# Patient Record
Sex: Male | Born: 1970 | Race: Black or African American | Hispanic: No | Marital: Married | Smoking: Never smoker
Health system: Southern US, Community
[De-identification: ages and names within clinical notes are randomized; demographics above are authoritative.]

## PROBLEM LIST (undated history)

## (undated) DIAGNOSIS — I10 Essential (primary) hypertension: Secondary | ICD-10-CM

## (undated) HISTORY — PX: BACK SURGERY: SHX140

## (undated) HISTORY — PX: KNEE SURGERY: SHX244

## (undated) HISTORY — PX: NECK SURGERY: SHX720

## (undated) HISTORY — PX: ACHILLES TENDON REPAIR: SUR1153

## (undated) HISTORY — PX: ROTATOR CUFF REPAIR: SHX139

---

## 1999-06-22 ENCOUNTER — Emergency Department (HOSPITAL_COMMUNITY): Admission: EM | Admit: 1999-06-22 | Discharge: 1999-06-22 | Payer: Self-pay

## 1999-06-22 ENCOUNTER — Encounter: Payer: Self-pay | Admitting: General Surgery

## 2000-04-13 ENCOUNTER — Emergency Department (HOSPITAL_COMMUNITY): Admission: EM | Admit: 2000-04-13 | Discharge: 2000-04-13 | Payer: Self-pay | Admitting: Emergency Medicine

## 2021-06-02 ENCOUNTER — Emergency Department (HOSPITAL_BASED_OUTPATIENT_CLINIC_OR_DEPARTMENT_OTHER)
Admission: EM | Admit: 2021-06-02 | Discharge: 2021-06-02 | Disposition: A | Discharge status: Home / Self Care | Payer: Medicare Other | Attending: Emergency Medicine | Admitting: Emergency Medicine

## 2021-06-02 ENCOUNTER — Other Ambulatory Visit: Payer: Self-pay

## 2021-06-02 ENCOUNTER — Encounter (HOSPITAL_BASED_OUTPATIENT_CLINIC_OR_DEPARTMENT_OTHER): Payer: Self-pay | Admitting: *Deleted

## 2021-06-02 ENCOUNTER — Emergency Department (HOSPITAL_BASED_OUTPATIENT_CLINIC_OR_DEPARTMENT_OTHER): Payer: Medicare Other

## 2021-06-02 DIAGNOSIS — I10 Essential (primary) hypertension: Secondary | ICD-10-CM | POA: Diagnosis not present

## 2021-06-02 DIAGNOSIS — M79671 Pain in right foot: Secondary | ICD-10-CM

## 2021-06-02 DIAGNOSIS — M25571 Pain in right ankle and joints of right foot: Secondary | ICD-10-CM | POA: Diagnosis present

## 2021-06-02 HISTORY — DX: Essential (primary) hypertension: I10

## 2021-06-02 NOTE — Discharge Instructions (Signed)
Please read and follow all provided instructions.  Your diagnoses today include:  1. Pain of right heel     Tests performed today include: An x-ray of the affected area - does NOT show any broken bones Vital signs. See below for your results today.   Medications prescribed:  Please use over-the-counter NSAID medications (ibuprofen, naproxen) as directed on the packaging for pain.   Take any prescribed medications only as directed.  Home care instructions:  Follow any educational materials contained in this packet Follow R.I.C.E. Protocol: R - rest your injury  I  - use ice on injury without applying directly to skin C - compress injury with bandage or splint E - elevate the injury as much as possible  Follow-up instructions: Please follow-up with your primary care provider or the provided orthopedic physician (bone specialist) if you continue to have significant pain in 1 week. In this case you may have a more severe injury that requires further care.   Return instructions:  Please return if your toes or feet are numb or tingling, appear gray or blue, or you have severe pain (also elevate the leg and loosen splint or wrap if you were given one) Please return to the Emergency Department if you experience worsening symptoms.  Please return if you have any other emergent concerns.  Additional Information:  Your vital signs today were: BP (!) 147/101 (BP Location: Right Arm)   Pulse 80   Temp 98.5 F (36.9 C) (Oral)   Resp 20   Ht 5' 9"  (1.753 m)   Wt 94.8 kg   SpO2 100%   BMI 30.86 kg/m  If your blood pressure (BP) was elevated above 135/85 this visit, please have this repeated by your doctor within one month. -------------- If prescribed crutches for your injury: use crutches with non-weight bearing for the first few days. Then, you may walk as the pain allows, or as instructed. Start gradually with weight bearing on the affected side. Once you can walk pain free, then try  jogging. When you can run forwards, then you can try moving side-to-side. If you cannot walk without crutches in one week, you need a re-check. --------------

## 2021-06-02 NOTE — ED Triage Notes (Signed)
Pt reports he ruptured his right achilles tendon in 2007. Two days ago he began having pain in the same area after getting up out of bed. Reports increased pain and swelling over the last 2 days. States he has been icing and elevating foot x 2 days

## 2021-06-02 NOTE — ED Provider Notes (Signed)
[facility] EMERGENCY DEPARTMENT Provider Note   CSN: [id number] Arrival date & time: 06/02/21  1248     History Chief Complaint  Patient presents with   Foot Pain    Steve Wilkerson is a 50 y.o. male.  Patient with remote history of right Achilles rupture status postsurgical repair --presents to the emergency department for evaluation of right heel pain.  Patient denies acute injury.  He states that he woke up a couple days ago and had pain in the heel, worse with bearing weight and walking.  Symptoms have felt severe at times.  Denies knee pain.  Not improved with conservative treatments at home, including applying ice and heat.  No fevers.      Past Medical History:  Diagnosis Date   Hypertension     There are no problems to display for this patient.   The histories are not reviewed yet. Please review them in the "History" navigator section and refresh this [place].     No family history on file.  Social History   Tobacco Use   Smoking status: Never    Passive exposure: Never   Smokeless tobacco: Never  Substance Use Topics   Alcohol use: Yes    Comment: occasional    Home Medications Prior to Admission medications   Not on File    Allergies    Patient has no known allergies.  Review of Systems   Review of Systems  Constitutional:  Negative for fever.  Musculoskeletal:  Positive for arthralgias, gait problem and joint swelling. Negative for back pain.  Skin:  Negative for wound.  Neurological:  Negative for weakness and numbness.   Physical Exam Updated Vital Signs BP (!) 147/101 (BP Location: Right Arm)   Pulse 80   Temp 98.5 F (36.9 C) (Oral)   Resp 20   Ht 5' 9"  (1.753 m)   Wt 94.8 kg   SpO2 100%   BMI 30.86 kg/m   Physical Exam Vitals and nursing note reviewed.  Constitutional:      Appearance: He is well-developed.  HENT:     Head: Normocephalic and atraumatic.  Eyes:     Conjunctiva/sclera: Conjunctivae normal.   Cardiovascular:     Pulses: Normal pulses. No decreased pulses.  Musculoskeletal:        General: Tenderness present.     Cervical back: Normal range of motion and neck supple.     Right knee: Normal range of motion. No tenderness.     Right lower leg: No edema.     Left lower leg: No edema.     Right ankle: Tenderness present. Decreased range of motion.     Right Achilles Tendon: Tenderness present. No defects. Thompson's test negative.     Left ankle:     Left Achilles Tendon: No tenderness or defects. Thompson's test negative.  Skin:    General: Skin is warm and dry.  Neurological:     Mental Status: He is alert.     Sensory: No sensory deficit.     Comments: Motor, sensation, and vascular distal to the injury is fully intact.   Psychiatric:        Mood and Affect: Mood normal.    ED Results / Procedures / Treatments   Labs (all labs ordered are listed, but only abnormal results are displayed) Labs Reviewed - No data to display  EKG None  Radiology No results found.  Procedures Procedures   Medications Ordered in ED Medications - No  data to display  ED Course  I have reviewed the triage vital signs and the nursing notes.  Pertinent labs & imaging results that were available during my care of the patient were reviewed by me and considered in my medical decision making (see chart for details).  Patient seen and examined.  Will obtain x-ray.  If negative, will give crutches, sports medicine follow-up.  Vital signs reviewed and are as follows: BP (!) 147/101 (BP Location: Right Arm)   Pulse 80   Temp 98.5 F (36.9 C) (Oral)   Resp 20   Ht 5' 9"  (1.753 m)   Wt 94.8 kg   SpO2 100%   BMI 30.86 kg/m   3:58 PM x-ray negative.  Patient updated.  Orthopedic referral provided.  Discussed rice protocol, NSAIDs.  Provided crutches.      MDM Rules/Calculators/A&P                           Patient with heel pain, no acute injuries.  History of Achilles tendon  repair.  No signs of disruption today.  Encouraged orthopedic follow-up.  Final Clinical Impression(s) / ED Diagnoses Final diagnoses:  Pain of right heel    Rx / DC Orders ED Discharge Orders     None        Carlisle Cater, PA-C 06/02/21 1558    Arnaldo Natal, MD 06/02/21 1606

## 2021-06-03 NOTE — Progress Notes (Signed)
  Steve Wilkerson - 49 y.o. male MRN [id number]  Date of birth: 02-24-1971  SUBJECTIVE:  Including CC & ROS.  No chief complaint on file.   Steve Wilkerson is a 50 y.o. male that is presenting with right Achilles pain.  He was using a Metall[employer]ist and had subsequent severe pain of the Achilles.  Having swelling and trouble walking.  Has a history of Achilles rupture and repair in 2007.  Has been using ice.  Independent review of the right ankle x-ray from 7/31 shows no acute changes.   Review of Systems See HPI   HISTORY: Past Medical, Surgical, Social, and Family History Reviewed & Updated per EMR.   Pertinent Historical Findings include:  Past Medical History:  Diagnosis Date   Hypertension     Past Surgical History:  Procedure Laterality Date   ACHILLES TENDON REPAIR     BACK SURGERY     KNEE SURGERY     NECK SURGERY     ROTATOR CUFF REPAIR      History reviewed. No pertinent family history.  Social History   Socioeconomic History   Marital status: Married    Spouse name: Not on file   Number of children: Not on file   Years of education: Not on file   Highest education level: Not on file  Occupational History   Not on file  Tobacco Use   Smoking status: Never    Passive exposure: Never   Smokeless tobacco: Never  Substance and Sexual Activity   Alcohol use: Yes    Comment: occasional   Drug use: Not on file   Sexual activity: Not on file  Other Topics Concern   Not on file  Social History Narrative   Not on file   Social Determinants of Health   Financial Resource Strain: Not on file  Food Insecurity: Not on file  Transportation Needs: Not on file  Physical Activity: Not on file  Stress: Not on file  Social Connections: Not on file  Intimate Partner Violence: Not on file     PHYSICAL EXAM:  VS: BP (!) 126/100 (BP Location: Left Arm, Patient Position: Sitting, Cuff Size: Large)   Ht 5' 10"  (1.778 m)   Wt 209 lb (94.8 kg)   BMI 29.99 kg/m   Physical Exam Gen: NAD, alert, cooperative with exam, well-appearing MSK:  Right Achilles: Swelling appreciated of the Achilles. Plantarflexion with [place] Silos test. Neurovascularly intact  Limited ultrasound: Right Achilles:  Normal insertion and proximal Achilles tendon. Hyperechoic changes representing the sutures from previous repair. The mid belly of the Achilles is widened with hypoechoic and hyperemia changes  Summary: Partial tearing of the Achilles  Ultrasound and interpretation by Clearance Coots, MD    ASSESSMENT & PLAN:   Partial tear of right Achilles tendon History of repair of rupture in 2007.  Has plantarflexion on exam with significant thickening and acute changes on ultrasound. -Counseled on home exercise therapy and supportive care. -Heel lift and cam walker. -Nitro patches. -Could consider physical therapy.

## 2021-06-04 ENCOUNTER — Other Ambulatory Visit: Payer: Self-pay

## 2021-06-04 ENCOUNTER — Ambulatory Visit (INDEPENDENT_AMBULATORY_CARE_PROVIDER_SITE_OTHER): Payer: Medicare Other | Admitting: Family Medicine

## 2021-06-04 ENCOUNTER — Ambulatory Visit: Payer: Self-pay

## 2021-06-04 ENCOUNTER — Encounter: Payer: Self-pay | Admitting: Family Medicine

## 2021-06-04 VITALS — BP 126/100 | Ht 70.0 in | Wt 209.0 lb

## 2021-06-04 DIAGNOSIS — S86011A Strain of right Achilles tendon, initial encounter: Secondary | ICD-10-CM | POA: Diagnosis not present

## 2021-06-04 DIAGNOSIS — M7660 Achilles tendinitis, unspecified leg: Secondary | ICD-10-CM

## 2021-06-04 MED ORDER — NITROGLYCERIN 0.2 MG/HR TD PT24: MEDICATED_PATCH | TRANSDERMAL | 11 refills | Status: DC

## 2021-06-04 NOTE — Assessment & Plan Note (Signed)
History of repair of rupture in 2007.  Has plantarflexion on exam with significant thickening and acute changes on ultrasound. -Counseled on home exercise therapy and supportive care. -Heel lift and cam walker. -Nitro patches. -Could consider physical therapy.

## 2021-06-04 NOTE — Patient Instructions (Signed)
Nice to meet you Please continue ice  Please use the boot with heel lift  Please try the nitro patches   Please send me a message in MyChart with any questions or updates.  Please see me back in 2-3 weeks.   --Dr. Raeford Razor  Nitroglycerin Protocol  Apply 1/4 nitroglycerin patch to affected area daily. Change position of patch within the affected area every 24 hours. You may experience a headache during the first 1-2 weeks of using the patch, these should subside. If you experience headaches after beginning nitroglycerin patch treatment, you may take your preferred over the counter pain reliever. Another side effect of the nitroglycerin patch is skin irritation or rash related to patch adhesive. Please notify our office if you develop more severe headaches or rash, and stop the patch. Tendon healing with nitroglycerin patch may require 12 to 24 weeks depending on the extent of injury. Men should not use if taking Viagra, Cialis, or Levitra.  Do not use if you have migraines or rosacea.

## 2021-06-17 ENCOUNTER — Ambulatory Visit (INDEPENDENT_AMBULATORY_CARE_PROVIDER_SITE_OTHER): Payer: Medicare Other | Admitting: Family Medicine

## 2021-06-17 ENCOUNTER — Other Ambulatory Visit: Payer: Self-pay

## 2021-06-17 ENCOUNTER — Encounter: Payer: Self-pay | Admitting: Family Medicine

## 2021-06-17 VITALS — BP 150/92 | Ht 70.0 in | Wt 209.0 lb

## 2021-06-17 DIAGNOSIS — S86011D Strain of right Achilles tendon, subsequent encounter: Secondary | ICD-10-CM

## 2021-06-17 NOTE — Progress Notes (Signed)
  Steve Wilkerson - 50 y.o. male MRN [id number]  Date of birth: May 19, 1971  SUBJECTIVE:  Including CC & ROS.  No chief complaint on file.   Steve Wilkerson is a 50 y.o. male that is following up for his Achilles pain.  He has been improving in his function and movement..   Review of Systems See HPI   HISTORY: Past Medical, Surgical, Social, and Family History Reviewed & Updated per EMR.   Pertinent Historical Findings include:  Past Medical History:  Diagnosis Date   Hypertension     Past Surgical History:  Procedure Laterality Date   ACHILLES TENDON REPAIR     BACK SURGERY     KNEE SURGERY     NECK SURGERY     ROTATOR CUFF REPAIR      History reviewed. No pertinent family history.  Social History   Socioeconomic History   Marital status: Married    Spouse name: Not on file   Number of children: Not on file   Years of education: Not on file   Highest education level: Not on file  Occupational History   Not on file  Tobacco Use   Smoking status: Never    Passive exposure: Never   Smokeless tobacco: Never  Substance and Sexual Activity   Alcohol use: Yes    Comment: occasional   Drug use: Not on file   Sexual activity: Not on file  Other Topics Concern   Not on file  Social History Narrative   Not on file   Social Determinants of Health   Financial Resource Strain: Not on file  Food Insecurity: Not on file  Transportation Needs: Not on file  Physical Activity: Not on file  Stress: Not on file  Social Connections: Not on file  Intimate Partner Violence: Not on file     PHYSICAL EXAM:  VS: BP (!) 150/92 (BP Location: Left Arm, Patient Position: Sitting, Cuff Size: Large)   Ht 5' 10"  (1.778 m)   Wt 209 lb (94.8 kg)   BMI 29.99 kg/m  Physical Exam Gen: NAD, alert, cooperative with exam, well-appearing       ASSESSMENT & PLAN:   Partial tear of right Achilles tendon Has gotten improvement with the nitro patches. -Counseled on home exercise  therapy and supportive care. -Continue nitro patches. -Counseled on heel lifts. -Referral to physical therapy.

## 2021-06-17 NOTE — Patient Instructions (Signed)
Good to see you Please continue the heel lifts  Please try physical therapy   Please send me a message in MyChart with any questions or updates.  Please see me back in 4 weeks.   --Dr. Raeford Razor

## 2021-06-17 NOTE — Assessment & Plan Note (Signed)
Has gotten improvement with the nitro patches. -Counseled on home exercise therapy and supportive care. -Continue nitro patches. -Counseled on heel lifts. -Referral to physical therapy.

## 2022-07-20 IMAGING — DX DG ANKLE COMPLETE 3+V*R*
3 series · 3 of 3 positions shown · non-contrast
Comparison: None.

CLINICAL DATA: Heel pain.  Remote history of Achilles repair.

EXAM:
RIGHT ANKLE - COMPLETE 3+ VIEW

[ankle ap]
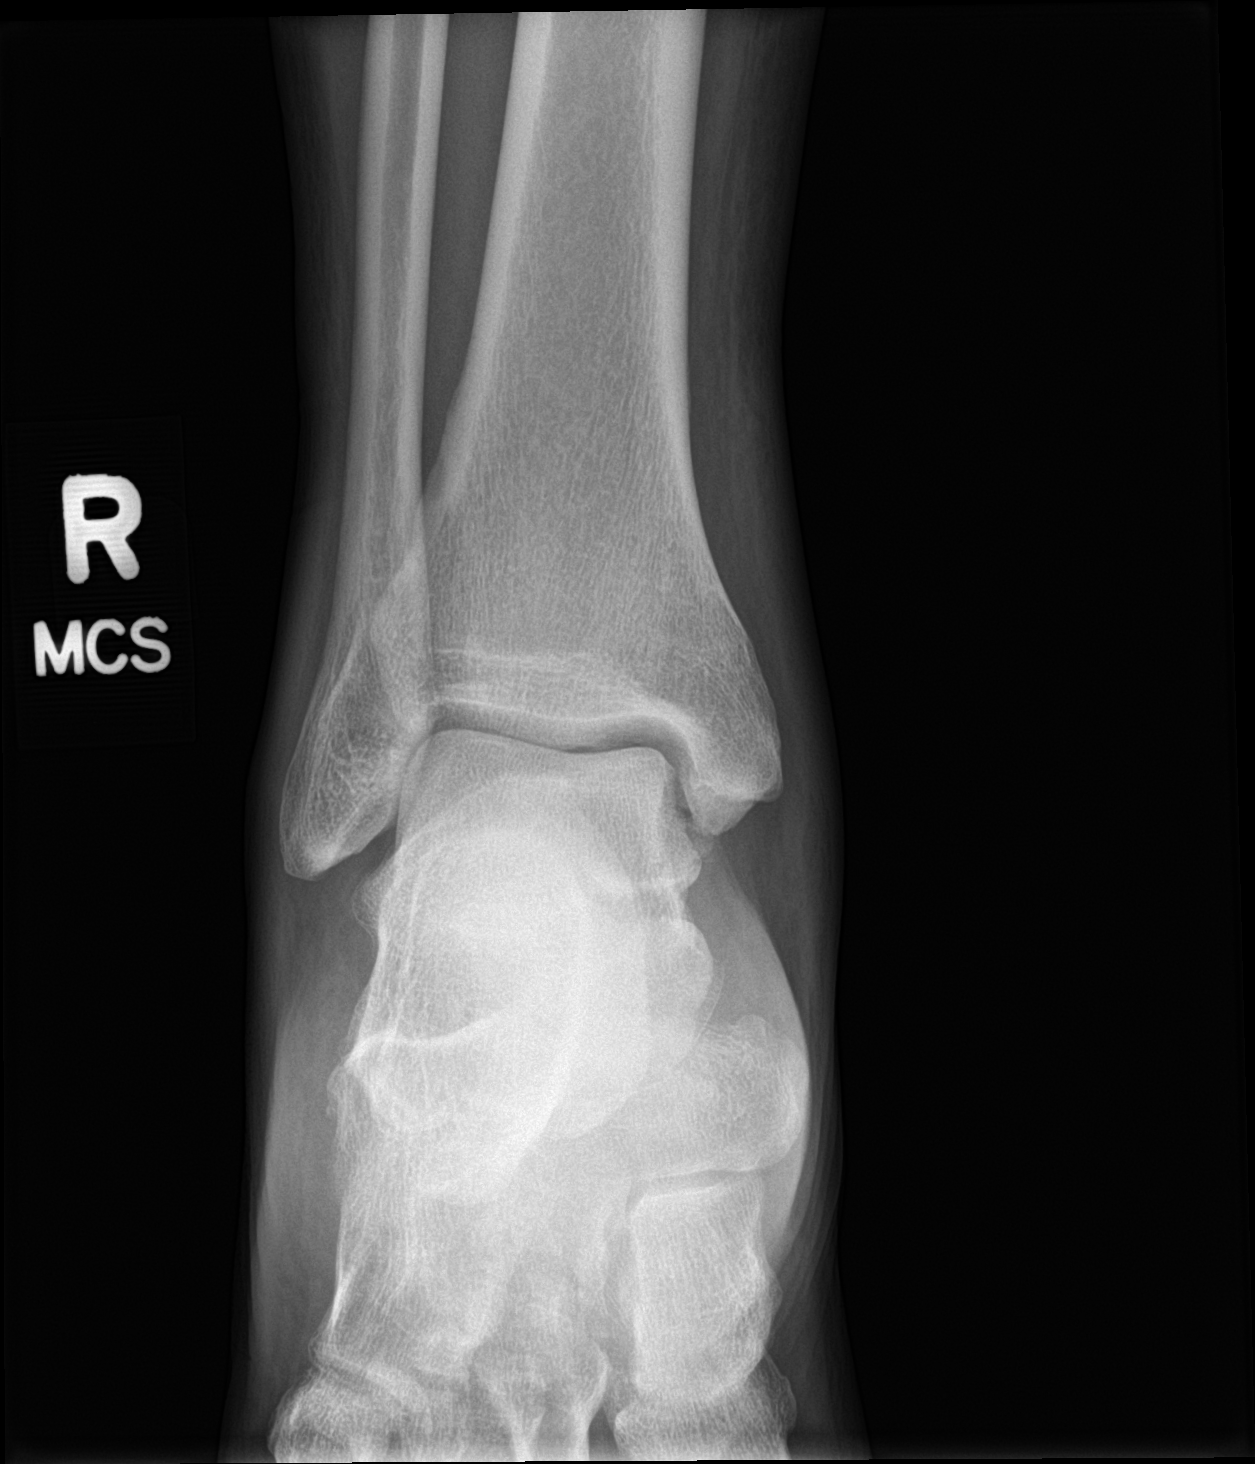

[ankle obl]
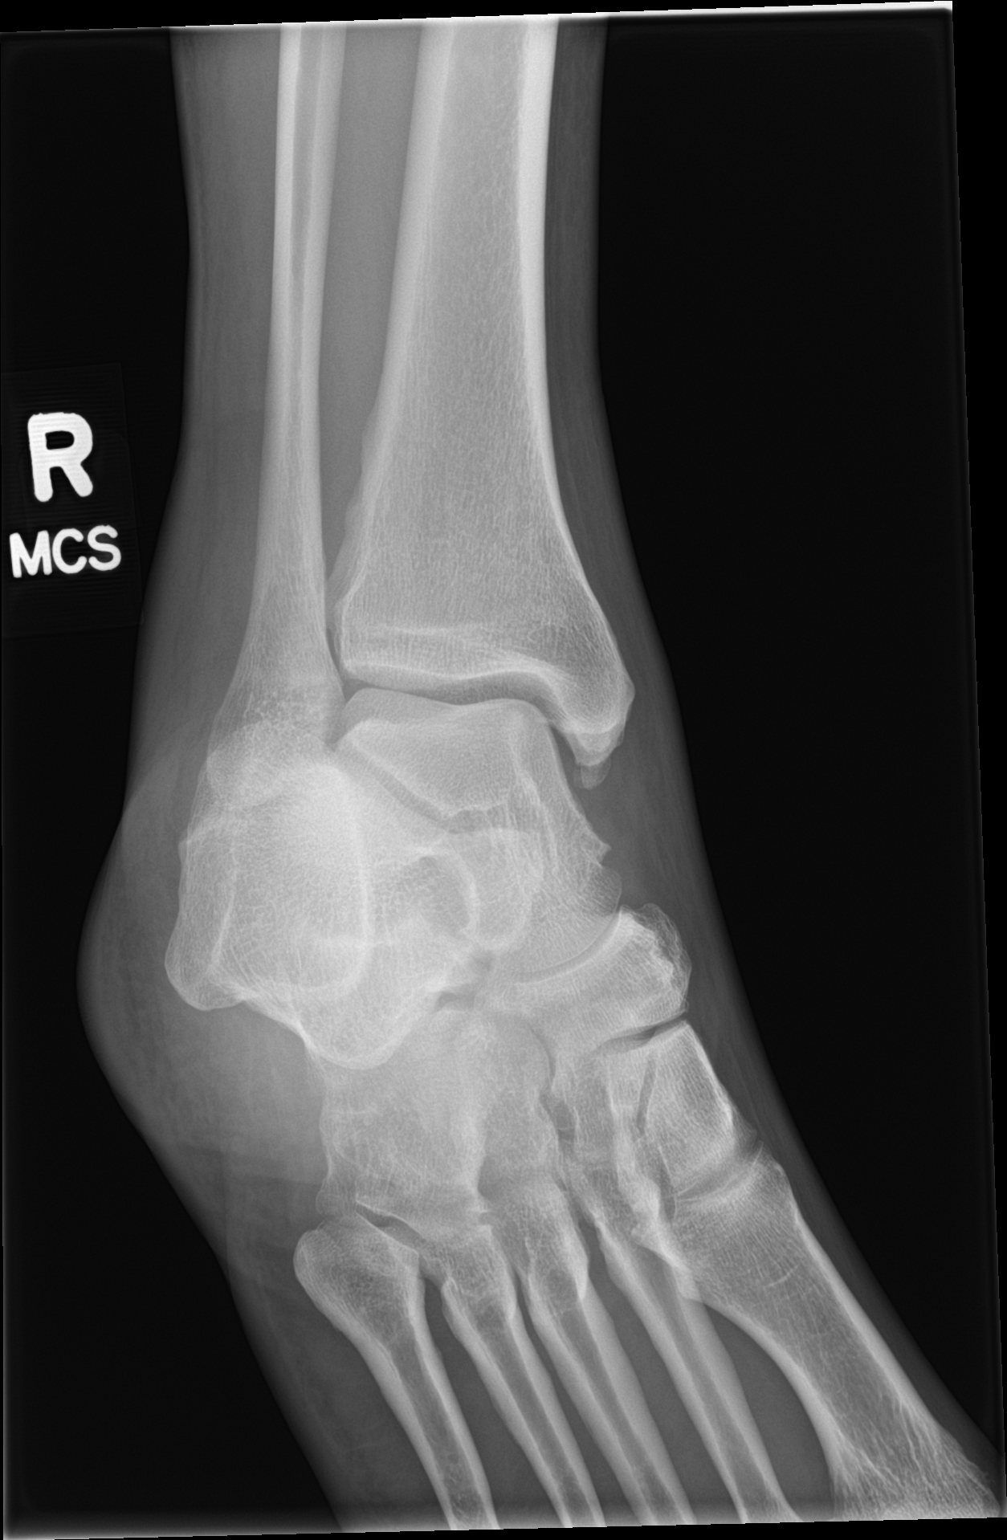

[ankle lat]
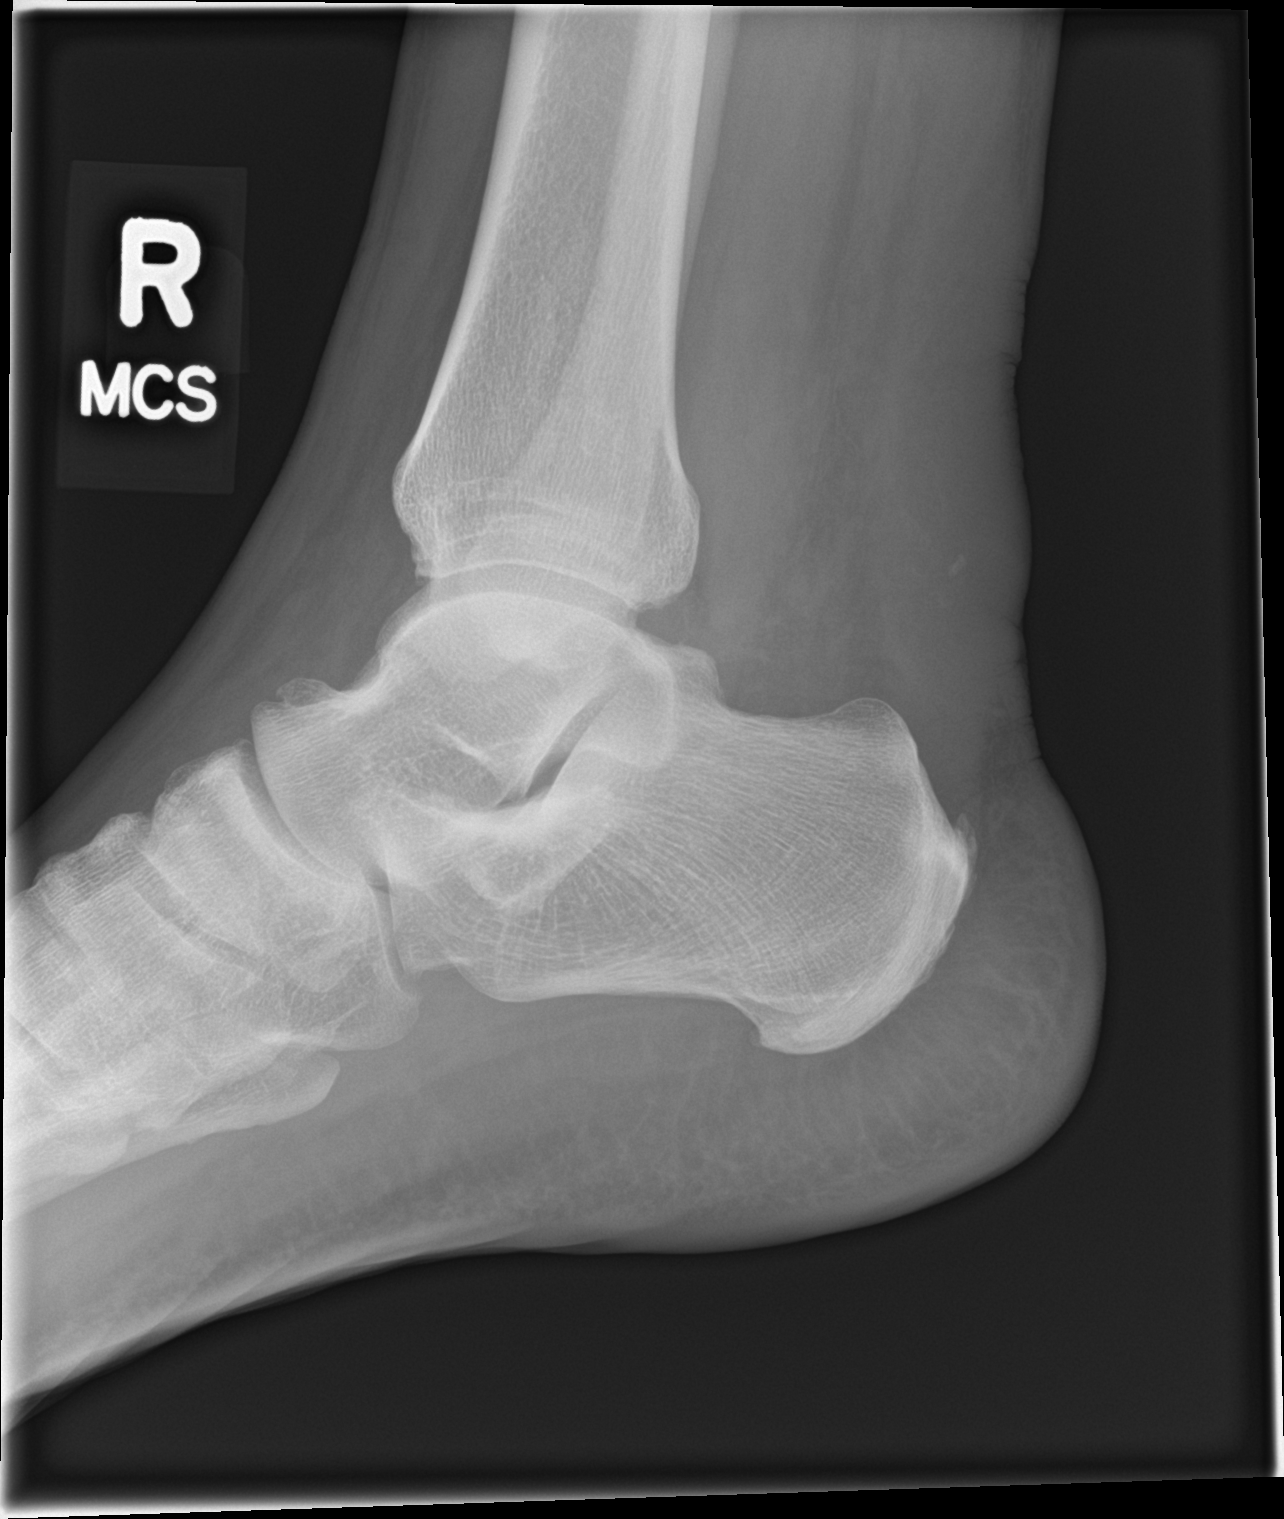

[3 of 3 positions shown; findings below may reference images not displayed]

FINDINGS: There is no evidence of fracture, dislocation, or joint effusion.
There is no evidence of arthropathy or other focal bone abnormality.
Soft tissues are unremarkable.
IMPRESSION: Negative.

## 2023-02-19 ENCOUNTER — Encounter: Payer: Self-pay | Admitting: *Deleted

## 2023-08-25 ENCOUNTER — Ambulatory Visit
Admission: EM | Admit: 2023-08-25 | Discharge: 2023-08-25 | Disposition: A | Discharge status: Home / Self Care | Payer: Medicare Other | Attending: Internal Medicine | Admitting: Internal Medicine

## 2023-08-25 DIAGNOSIS — T63441A Toxic effect of venom of bees, accidental (unintentional), initial encounter: Secondary | ICD-10-CM

## 2023-08-25 DIAGNOSIS — L03113 Cellulitis of right upper limb: Secondary | ICD-10-CM

## 2023-08-25 DIAGNOSIS — M7989 Other specified soft tissue disorders: Secondary | ICD-10-CM | POA: Diagnosis not present

## 2023-08-25 DIAGNOSIS — M79631 Pain in right forearm: Secondary | ICD-10-CM

## 2023-08-25 MED ORDER — CEPHALEXIN 500 MG PO CAPS: 500.0000 mg | ORAL_CAPSULE | Freq: Three times a day (TID) | ORAL | 0 refills | Status: AC

## 2023-08-25 MED ORDER — PREDNISONE 20 MG PO TABS: ORAL_TABLET | ORAL | 0 refills | Status: AC

## 2023-08-25 MED ORDER — EPINEPHRINE 0.3 MG/0.3ML IJ SOAJ: 0.3000 mg | INTRAMUSCULAR | 0 refills | Status: AC | PRN

## 2023-08-25 NOTE — ED Provider Notes (Signed)
[facility] - [facility]  Note:  This document was prepared using Conservation officer, historic buildings and may include unintentional dictation errors.  MRN: [id number] DOB: [birth year]  Subjective:   Steve Wilkerson is a 52 y.o. male presenting for 1 day history of moderate to severe swelling of the right forearm.  Patient was stung by an insect last night.  Believes it was a bee.  Has previously suffered a sting to the face which caused severe facial swelling but not anaphylaxis.  He has taken some Benadryl with some help.  However now he is feeling pain, more redness and warmth about the forearm.  No fever, throat closing sensation, shortness of breath, chest tightness, wheezing, nausea, vomiting.  No current facility-administered medications for this encounter.  Current Outpatient Medications:    nitroGLYCERIN (NITRODUR - DOSED IN MG/24 HR) 0.2 mg/hr patch, Cut and apply 1/4 patch to most painful area q24h., Disp: 30 patch, Rfl: 11   No Known Allergies  Past Medical History:  Diagnosis Date   Hypertension      Past Surgical History:  Procedure Laterality Date   ACHILLES TENDON REPAIR     BACK SURGERY     KNEE SURGERY     NECK SURGERY     ROTATOR CUFF REPAIR      No family history on file.  Social History   Tobacco Use   Smoking status: Never    Passive exposure: Never   Smokeless tobacco: Never  Substance Use Topics   Alcohol use: Yes    Comment: occasional    ROS   Objective:   Vitals: There were no vitals taken for this visit.  Physical Exam Constitutional:      General: He is not in acute distress.    Appearance: Normal appearance. He is well-developed and normal weight. He is not ill-appearing, toxic-appearing or diaphoretic.  HENT:     Head: Normocephalic and atraumatic.     Right Ear: Tympanic membrane, ear canal and external ear normal. No drainage, swelling or tenderness. No middle ear effusion. There is no impacted cerumen. Tympanic  membrane is not erythematous or bulging.     Left Ear: Tympanic membrane, ear canal and external ear normal. No drainage, swelling or tenderness.  No middle ear effusion. There is no impacted cerumen. Tympanic membrane is not erythematous or bulging.     Nose: Nose normal. No congestion or rhinorrhea.     Mouth/Throat:     Mouth: Mucous membranes are moist.     Pharynx: No oropharyngeal exudate or posterior oropharyngeal erythema.     Comments: Airway is patent, patient is controlling secretions, speaking in full sentences. Eyes:     General: No scleral icterus.       Right eye: No discharge.        Left eye: No discharge.     Extraocular Movements: Extraocular movements intact.     Conjunctiva/sclera: Conjunctivae normal.  Cardiovascular:     Rate and Rhythm: Normal rate and regular rhythm.     Heart sounds: Normal heart sounds. No murmur heard.    No friction rub. No gallop.  Pulmonary:     Effort: Pulmonary effort is normal. No respiratory distress.     Breath sounds: Normal breath sounds. No stridor. No wheezing, rhonchi or rales.  Musculoskeletal:       Arms:     Cervical back: Normal range of motion and neck supple. No rigidity. No muscular tenderness.     Comments: Patient is  neurovascularly intact distally.  Neurological:     General: No focal deficit present.     Mental Status: He is alert and oriented to person, place, and time.  Psychiatric:        Mood and Affect: Mood normal.        Behavior: Behavior normal.        Thought Content: Thought content normal.     Assessment and Plan :   PDMP not reviewed this encounter.  1. Pain and swelling of right forearm   2. Local reaction to bee sting, accidental or unintentional, initial encounter   3. Cellulitis of right upper extremity    I provided patient with an epinephrine pen in the event he suffers another insect sting from a bee, wasp.  Discussed appropriate use of this medication.  For his current localized  reaction, recommended prednisone and will cover for secondary cellulitis with cephalexin.  Counseled patient on potential for adverse effects with medications prescribed/recommended today, ER and return-to-clinic precautions discussed, patient verbalized understanding.    Wallis Bamberg, [place] 08/28/23 1610

## 2023-08-25 NOTE — ED Triage Notes (Signed)
Patient here today with c/o right arm swelling after being stung by a been last night. He also has some tingling sensation on face and left lower leg. Denies any pain. Denies difficulty breathing. He took Benadryl last night which helped some.

## 2024-05-03 ENCOUNTER — Ambulatory Visit
Admission: EM | Admit: 2024-05-03 | Discharge: 2024-05-03 | Disposition: A | Discharge status: Home / Self Care | Attending: Family Medicine | Admitting: Family Medicine

## 2024-05-03 DIAGNOSIS — R066 Hiccough: Secondary | ICD-10-CM | POA: Diagnosis not present

## 2024-05-03 MED ORDER — METOCLOPRAMIDE HCL 5 MG/ML IJ SOLN
10.0000 mg | Freq: Once | INTRAMUSCULAR | Status: AC
  Administered 2024-05-03: 10 mg via INTRAMUSCULAR

## 2024-05-03 MED ORDER — OMEPRAZOLE 20 MG PO CPDR: 20.0000 mg | DELAYED_RELEASE_CAPSULE | Freq: Every day | ORAL | 0 refills | Status: AC

## 2024-05-03 NOTE — ED Triage Notes (Signed)
 Pt reports persistent hiccups x 4 days after he had 2 steroids shot. States this happened if he has more than 1 steroid injection for back injuries.States if he talks he can control the hiccups better.

## 2024-05-03 NOTE — ED Provider Notes (Signed)
 [facility]  Note:  This document was prepared using Conservation officer, historic buildings and may include unintentional dictation errors.  MRN: [id number] DOB: June 23, 1971  Subjective:   Steve Wilkerson is a 53 y.o. male presenting for 4-day history of persistent hiccups.  Patient has personally had this kind of problem before when he undergoes multiple steroid treatments.  Unfortunately, he just received 2 steroid injections.  Thereafter his symptoms started.  He has previously had good response with injectable medications in clinic.  Has tried an old prescription that he had leftover from a previous episode for chlorpromazine.  However, he has not experienced relief.  Denies overt chest pain, shortness of breath.  He did have an episode of hiccups that was excessive and led to 1 episode of vomiting.  However, no vomiting thereafter.  No diarrhea, constipation, ear pain.  No other GI disorders.  Has tried multiple physical maneuvers at home all without relief.  No current facility-administered medications for this encounter.  Current Outpatient Medications:    amLODipine-benazepril (LOTREL) 10-40 MG capsule, Take 1 capsule by mouth daily., Disp: , Rfl:    atorvastatin (LIPITOR) 40 MG tablet, Take 40 mg by mouth daily., Disp: , Rfl:    cephALEXin  (KEFLEX ) 500 MG capsule, Take 1 capsule (500 mg total) by mouth 3 (three) times daily., Disp: 30 capsule, Rfl: 0   chlorproMAZINE (THORAZINE) 50 MG tablet, Take 50 mg by mouth 3 (three) times daily as needed., Disp: , Rfl:    diclofenac Sodium (VOLTAREN) 1 % GEL, Apply 2 g topically., Disp: , Rfl:    EPINEPHrine  0.3 mg/0.3 mL IJ SOAJ injection, Inject 0.3 mg into the muscle as needed for anaphylaxis., Disp: 2 each, Rfl: 0   hydrochlorothiazide (HYDRODIURIL) 25 MG tablet, Take 25 mg by mouth daily., Disp: , Rfl:    metoprolol succinate (TOPROL-XL) 100 MG 24 hr tablet, Take 100 mg by mouth daily., Disp: , Rfl:    Potassium Chloride  ER 20 MEQ TBCR, Take 1 tablet by mouth daily., Disp: , Rfl:    predniSONE  (DELTASONE ) 20 MG tablet, Take 2 tablets daily with breakfast., Disp: 10 tablet, Rfl: 0   Topiramate ER (TROKENDI XR) 100 MG CP24, Take 1 capsule by mouth daily., Disp: , Rfl:    WEGOVY 1.7 MG/0.75ML SOAJ, Inject 1.7 mg into the skin., Disp: , Rfl:    No Known Allergies  Past Medical History:  Diagnosis Date   Hypertension      Past Surgical History:  Procedure Laterality Date   ACHILLES TENDON REPAIR     BACK SURGERY     KNEE SURGERY     NECK SURGERY     ROTATOR CUFF REPAIR      History reviewed. No pertinent family history.  Social History   Tobacco Use   Smoking status: Never    Passive exposure: Never   Smokeless tobacco: Never  Vaping Use   Vaping status: Never Used  Substance Use Topics   Alcohol use: Not Currently    Comment: occasional   Drug use: Never    ROS   Objective:   Vitals: BP (!) 150/94 (BP Location: Right Arm)   Pulse 73   Temp 98.1 F (36.7 C) (Oral)   Resp 18   SpO2 98%   Physical Exam Constitutional:      General: He is not in acute distress.    Appearance: Normal appearance. He is well-developed and normal weight. He is not ill-appearing, toxic-appearing or diaphoretic.  HENT:     Head: Normocephalic and atraumatic.     Right Ear: Tympanic membrane, ear canal and external ear normal. No drainage, swelling or tenderness. No middle ear effusion. There is no impacted cerumen. Tympanic membrane is not erythematous or bulging.     Left Ear: Tympanic membrane, ear canal and external ear normal. No drainage, swelling or tenderness.  No middle ear effusion. There is no impacted cerumen. Tympanic membrane is not erythematous or bulging.     Nose: Nose normal. No congestion or rhinorrhea.     Mouth/Throat:     Mouth: Mucous membranes are moist.     Pharynx: Oropharynx is clear. No oropharyngeal exudate or posterior oropharyngeal erythema.   Eyes:     General: No  scleral icterus.       Right eye: No discharge.        Left eye: No discharge.     Extraocular Movements: Extraocular movements intact.     Conjunctiva/sclera: Conjunctivae normal.    Cardiovascular:     Rate and Rhythm: Normal rate and regular rhythm.     Heart sounds: Normal heart sounds. No murmur heard.    No friction rub. No gallop.  Pulmonary:     Effort: Pulmonary effort is normal. No respiratory distress.     Breath sounds: Normal breath sounds. No stridor. No wheezing, rhonchi or rales.  Abdominal:     General: Bowel sounds are normal. There is no distension.     Palpations: Abdomen is soft. There is no mass.     Tenderness: There is no abdominal tenderness. There is no right CVA tenderness, left CVA tenderness, guarding or rebound.   Musculoskeletal:     Cervical back: Normal range of motion and neck supple. No rigidity. No muscular tenderness.   Neurological:     General: No focal deficit present.     Mental Status: He is alert and oriented to person, place, and time.   Psychiatric:        Mood and Affect: Mood normal.        Behavior: Behavior normal.        Thought Content: Thought content normal.        Judgment: Judgment normal.     IM Reglan 10 mg administered in clinic.   Assessment and Plan :   PDMP not reviewed this encounter.  1. Hiccups    Patient got a prescription refill for chlorpromazine from his PCP.  I offered Prilosec as a trial as well for the recommended timeframe of 4 weeks.  Counseled patient on potential for adverse effects with medications prescribed/recommended today, ER and return-to-clinic precautions discussed, patient verbalized understanding.    Christopher Savannah, [place] 05/03/24 [id number]
